# Patient Record
Sex: Female | Born: 1975 | Race: Black or African American | Hispanic: No | Marital: Married | Smoking: Current every day smoker
Health system: Southern US, Community
[De-identification: ages and names within clinical notes are randomized; demographics above are authoritative.]

## PROBLEM LIST (undated history)

## (undated) DIAGNOSIS — E079 Disorder of thyroid, unspecified: Secondary | ICD-10-CM

## (undated) DIAGNOSIS — J45909 Unspecified asthma, uncomplicated: Secondary | ICD-10-CM

---

## 1997-12-19 ENCOUNTER — Encounter: Payer: Self-pay | Admitting: Emergency Medicine

## 1997-12-19 ENCOUNTER — Emergency Department (HOSPITAL_COMMUNITY): Admission: EM | Admit: 1997-12-19 | Discharge: 1997-12-19 | Payer: Self-pay | Admitting: Emergency Medicine

## 1997-12-20 ENCOUNTER — Encounter: Payer: Self-pay | Admitting: Emergency Medicine

## 1998-01-03 ENCOUNTER — Emergency Department (HOSPITAL_COMMUNITY): Admission: EM | Admit: 1998-01-03 | Discharge: 1998-01-03 | Payer: Self-pay | Admitting: Emergency Medicine

## 2001-11-17 ENCOUNTER — Emergency Department (HOSPITAL_COMMUNITY): Admission: EM | Admit: 2001-11-17 | Discharge: 2001-11-18 | Payer: Self-pay | Admitting: Emergency Medicine

## 2002-02-27 ENCOUNTER — Emergency Department (HOSPITAL_COMMUNITY): Admission: EM | Admit: 2002-02-27 | Discharge: 2002-02-27 | Payer: Self-pay | Admitting: Emergency Medicine

## 2002-12-10 ENCOUNTER — Inpatient Hospital Stay (HOSPITAL_COMMUNITY): Admission: AD | Admit: 2002-12-10 | Discharge: 2002-12-10 | Payer: Self-pay | Admitting: Obstetrics & Gynecology

## 2002-12-10 ENCOUNTER — Encounter: Payer: Self-pay | Admitting: Obstetrics & Gynecology

## 2002-12-13 ENCOUNTER — Inpatient Hospital Stay (HOSPITAL_COMMUNITY): Admission: AD | Admit: 2002-12-13 | Discharge: 2002-12-13 | Payer: Self-pay | Admitting: Family Medicine

## 2004-01-13 ENCOUNTER — Emergency Department (HOSPITAL_COMMUNITY): Admission: EM | Admit: 2004-01-13 | Discharge: 2004-01-13 | Payer: Self-pay | Admitting: Emergency Medicine

## 2005-06-02 ENCOUNTER — Inpatient Hospital Stay (HOSPITAL_COMMUNITY): Admission: AD | Admit: 2005-06-02 | Discharge: 2005-06-02 | Payer: Self-pay | Admitting: Obstetrics and Gynecology

## 2005-06-08 ENCOUNTER — Inpatient Hospital Stay (HOSPITAL_COMMUNITY): Admission: AD | Admit: 2005-06-08 | Discharge: 2005-06-08 | Payer: Self-pay | Admitting: *Deleted

## 2010-04-30 ENCOUNTER — Encounter: Payer: Self-pay | Admitting: Family Medicine

## 2012-11-09 ENCOUNTER — Encounter (HOSPITAL_BASED_OUTPATIENT_CLINIC_OR_DEPARTMENT_OTHER): Payer: Self-pay | Admitting: *Deleted

## 2012-11-09 ENCOUNTER — Emergency Department (HOSPITAL_BASED_OUTPATIENT_CLINIC_OR_DEPARTMENT_OTHER)
Admission: EM | Admit: 2012-11-09 | Discharge: 2012-11-09 | Disposition: A | Discharge status: Home / Self Care | Payer: BC Managed Care – PPO | Attending: Emergency Medicine | Admitting: Emergency Medicine

## 2012-11-09 DIAGNOSIS — J45909 Unspecified asthma, uncomplicated: Secondary | ICD-10-CM | POA: Insufficient documentation

## 2012-11-09 DIAGNOSIS — M542 Cervicalgia: Secondary | ICD-10-CM

## 2012-11-09 DIAGNOSIS — Z87891 Personal history of nicotine dependence: Secondary | ICD-10-CM | POA: Insufficient documentation

## 2012-11-09 DIAGNOSIS — Z8639 Personal history of other endocrine, nutritional and metabolic disease: Secondary | ICD-10-CM | POA: Insufficient documentation

## 2012-11-09 DIAGNOSIS — Z862 Personal history of diseases of the blood and blood-forming organs and certain disorders involving the immune mechanism: Secondary | ICD-10-CM | POA: Insufficient documentation

## 2012-11-09 HISTORY — DX: Disorder of thyroid, unspecified: E07.9

## 2012-11-09 HISTORY — DX: Unspecified asthma, uncomplicated: J45.909

## 2012-11-09 MED ORDER — HYDROCODONE-ACETAMINOPHEN 5-325 MG PO TABS: 2.0000 | ORAL_TABLET | Freq: Four times a day (QID) | ORAL | Status: DC | PRN

## 2012-11-09 MED ORDER — DIAZEPAM 5 MG PO TABS: 5.0000 mg | ORAL_TABLET | Freq: Four times a day (QID) | ORAL | Status: DC | PRN

## 2012-11-09 NOTE — ED Provider Notes (Signed)
CSN: [id number]     Arrival date & time 11/09/12  1350 History     First MD Initiated Contact with Patient 11/09/12 1422     Chief Complaint  Patient presents with  . Neck Pain   (Consider location/radiation/quality/duration/timing/severity/associated sxs/prior Treatment) HPI This 37 year old female has cough congestion and wheezing shortness of breath a week ago it resolved after taking an inhaler steroids and Z-Pak, she now presents with a few days of gradual onset bilateral posterior neck pain worse with movement and better she stays still without radiation or associated symptoms. She is no fever no headache no change in speech vision swallowing or understanding. She is no focal weakness numbness or incoordination. She is no back pain or change in bowel or bladder function. She is no chest pain shortness breath abdominal pain vomiting or rashes or trauma. She has partial improvement using over-the-counter anti-inflammatories. Her pain is constant and mild to moderate worse with palpation and position changes of her head. She is able to walk without difficulty. She denies IV drug use or prior neck or back surgeries. Past Medical History  Diagnosis Date  . Asthma   . Thyroid disease    History reviewed. No pertinent past surgical history. No family history on file. History  Substance Use Topics  . Smoking status: Former Games developer  . Smokeless tobacco: Not on file  . Alcohol Use: No   OB History   Grav Para Term Preterm Abortions TAB SAB Ect Mult Living                 Review of Systems 10 Systems reviewed and are negative for acute change except as noted in the HPI. Allergies  Review of patient's allergies indicates no known allergies.  Home Medications   Current Outpatient Rx  Name  Route  Sig  Dispense  Refill  . albuterol (PROVENTIL HFA;VENTOLIN HFA) 108 (90 BASE) MCG/ACT inhaler   Inhalation   Inhale 2 puffs into the lungs every 6 (six) hours as needed for wheezing.          . diazepam (VALIUM) 5 MG tablet   Oral   Take 1 tablet (5 mg total) by mouth every 6 (six) hours as needed (spasms).   6 tablet   0   . HYDROcodone-acetaminophen (NORCO) 5-325 MG per tablet   Oral   Take 2 tablets by mouth every 6 (six) hours as needed for pain.   12 tablet   0    BP 150/92  Pulse 66  Temp(Src) 97.8 F (36.6 C) (Oral)  Resp 18  Ht 4\' 11"  (1.499 m)  Wt 110 lb (49.896 kg)  BMI 22.21 kg/m2  SpO2 100% Physical Exam  Nursing note and vitals reviewed. Constitutional:  Awake, alert, nontoxic appearance with baseline speech for patient.  HENT:  Head: Atraumatic.  Mouth/Throat: No oropharyngeal exudate.  Eyes: EOM are normal. Pupils are equal, round, and reactive to light. Right eye exhibits no discharge. Left eye exhibits no discharge.  Neck: Neck supple.  Minimally decreased active range of motion with good flexion good extension slightly decreased left and right rotation, no midline neck tenderness, no rash, she does have reproducible bilateral paracervical muscle tenderness, she has minimal bilateral trapezius muscle tenderness, her back is nontender  Cardiovascular: Normal rate and regular rhythm.   No murmur heard. Pulmonary/Chest: Effort normal and breath sounds normal. No stridor. No respiratory distress. She has no wheezes. She has no rales. She exhibits no tenderness.  Abdominal: Soft. Bowel sounds  are normal. She exhibits no mass. There is no tenderness. There is no rebound.  Musculoskeletal: She exhibits no tenderness.  Baseline ROM, moves extremities with no obvious new focal weakness.  Lymphadenopathy:    She has no cervical adenopathy.  Neurological: She is alert.  Awake, alert, cooperative and aware of situation; motor strength bilaterally; sensation normal to light touch bilaterally; peripheral visual fields full to confrontation; no facial asymmetry; tongue midline; major cranial nerves appear intact; no pronator drift, normal finger to nose  bilaterally  Skin: No rash noted.  Psychiatric: She has a normal mood and affect.    ED Course  Patient informed of clinical course, understand medical decision-making process, and agree with plan. Procedures (including critical care time)  Labs Reviewed - No data to display No results found. 1. Neck pain, bilateral     MDM  I doubt any other EMC precluding discharge at this time including, but not necessarily limited to the following:bacterial meningitis.  Hurman Horn, MD 11/09/12 2226

## 2012-11-09 NOTE — ED Notes (Signed)
Patient states that a week ago she was treated for bronchospasms, took a z-pack and prednisone. # days ago began having severe pain and stiffness in her neck

## 2013-04-26 ENCOUNTER — Emergency Department (HOSPITAL_BASED_OUTPATIENT_CLINIC_OR_DEPARTMENT_OTHER)
Admission: EM | Admit: 2013-04-26 | Discharge: 2013-04-26 | Disposition: A | Discharge status: Home / Self Care | Payer: BC Managed Care – PPO | Attending: Emergency Medicine | Admitting: Emergency Medicine

## 2013-04-26 ENCOUNTER — Encounter (HOSPITAL_BASED_OUTPATIENT_CLINIC_OR_DEPARTMENT_OTHER): Payer: Self-pay | Admitting: Emergency Medicine

## 2013-04-26 DIAGNOSIS — Z79899 Other long term (current) drug therapy: Secondary | ICD-10-CM | POA: Insufficient documentation

## 2013-04-26 DIAGNOSIS — R110 Nausea: Secondary | ICD-10-CM | POA: Insufficient documentation

## 2013-04-26 DIAGNOSIS — Z87891 Personal history of nicotine dependence: Secondary | ICD-10-CM | POA: Insufficient documentation

## 2013-04-26 DIAGNOSIS — J45901 Unspecified asthma with (acute) exacerbation: Secondary | ICD-10-CM | POA: Insufficient documentation

## 2013-04-26 DIAGNOSIS — Z8639 Personal history of other endocrine, nutritional and metabolic disease: Secondary | ICD-10-CM | POA: Insufficient documentation

## 2013-04-26 DIAGNOSIS — Z862 Personal history of diseases of the blood and blood-forming organs and certain disorders involving the immune mechanism: Secondary | ICD-10-CM | POA: Insufficient documentation

## 2013-04-26 MED ORDER — ALBUTEROL SULFATE HFA 108 (90 BASE) MCG/ACT IN AERS
2.0000 | INHALATION_SPRAY | Freq: Once | RESPIRATORY_TRACT | Status: AC
  Administered 2013-04-26: 2 via RESPIRATORY_TRACT
  Filled 2013-04-26: qty 6.7

## 2013-04-26 MED ORDER — ALBUTEROL SULFATE (2.5 MG/3ML) 0.083% IN NEBU
INHALATION_SOLUTION | RESPIRATORY_TRACT | Status: AC
  Administered 2013-04-26: 2.5 mg
  Filled 2013-04-26: qty 3

## 2013-04-26 MED ORDER — ALBUTEROL SULFATE (2.5 MG/3ML) 0.083% IN NEBU: 2.5000 mg | INHALATION_SOLUTION | Freq: Four times a day (QID) | RESPIRATORY_TRACT | Status: DC | PRN

## 2013-04-26 MED ORDER — PREDNISONE 10 MG PO TABS: 20.0000 mg | ORAL_TABLET | Freq: Two times a day (BID) | ORAL | Status: DC

## 2013-04-26 MED ORDER — IPRATROPIUM-ALBUTEROL 0.5-2.5 (3) MG/3ML IN SOLN
RESPIRATORY_TRACT | Status: AC
  Administered 2013-04-26: 3 mL
  Filled 2013-04-26: qty 3

## 2013-04-26 NOTE — ED Provider Notes (Signed)
CSN: [id number]     Arrival date & time 04/26/13  1155 History   First MD Initiated Contact with Patient 04/26/13 1336     Chief Complaint  Patient presents with  . Shortness of Breath  . Chest Pain   (Consider location/radiation/quality/duration/timing/severity/associated sxs/prior Treatment) Patient is a 38 y.o. female presenting with shortness of breath and chest pain. The history is provided by the patient.  Shortness of Breath Severity:  Moderate Onset quality:  Gradual Duration:  24 hours Timing:  Constant Progression:  Worsening Chronicity:  New Relieved by:  None tried Worsened by:  Activity and weather changes Ineffective treatments:  None tried Associated symptoms: chest pain, cough and wheezing   Associated symptoms: no abdominal pain, no ear pain, no fever, no sore throat and no vomiting   Chest Pain Associated symptoms: cough, nausea and shortness of breath   Associated symptoms: no abdominal pain, no fever and not vomiting    Dierdra J Fontanella is a 38 y.o. female who presents to the ED with wheezing and feeling short of breath. She states she is out of her inhaler so her symptoms worsened. She also has a nebulizer but is out of the medication for that as well.  She has a history of asthma.   Past Medical History  Diagnosis Date  . Asthma   . Thyroid disease    History reviewed. No pertinent past surgical history. History reviewed. No pertinent family history. History  Substance Use Topics  . Smoking status: Former Games developer  . Smokeless tobacco: Not on file  . Alcohol Use: No   OB History   Grav Para Term Preterm Abortions TAB SAB Ect Mult Living                 Review of Systems  Constitutional: Negative for fever and chills.  HENT: Negative for congestion, ear pain and sore throat.   Respiratory: Positive for cough, shortness of breath and wheezing.   Cardiovascular: Positive for chest pain.  Gastrointestinal: Positive for nausea. Negative for  vomiting and abdominal pain.  Genitourinary: Negative for dysuria, urgency and frequency.  Musculoskeletal: Negative for myalgias.  Neurological: Negative for syncope.  Psychiatric/Behavioral: Negative for confusion. The patient is not nervous/anxious.     Allergies  Grapefruit bioflavonoid complex and Shellfish allergy  Home Medications   Current Outpatient Rx  Name  Route  Sig  Dispense  Refill  . albuterol (PROVENTIL HFA;VENTOLIN HFA) 108 (90 BASE) MCG/ACT inhaler   Inhalation   Inhale 2 puffs into the lungs every 6 (six) hours as needed for wheezing.          BP 126/66  Pulse 82  Temp(Src) 98.2 F (36.8 C) (Oral)  Resp 20  SpO2 100%  LMP 04/26/2013 Physical Exam  Nursing note and vitals reviewed. Constitutional: She is oriented to person, place, and time. She appears well-developed and well-nourished. No distress.  HENT:  Head: Normocephalic and atraumatic.  Eyes: EOM are normal.  Neck: Neck supple.  Cardiovascular: Normal rate.   Pulmonary/Chest: Effort normal. She has wheezes.  Abdominal: Soft. There is no tenderness.  Musculoskeletal: Normal range of motion.  Neurological: She is alert and oriented to person, place, and time. No cranial nerve deficit.  Skin: Skin is warm and dry.  Psychiatric: She has a normal mood and affect. Her behavior is normal.    ED Course  Procedures Albuterol/atrovent neb. MDM  Examined after neb treatment and patient feeling much better, no chest tightness, no wheezing.  BP  126/66  Pulse 82  Temp(Src) 98.2 F (36.8 C) (Oral)  Resp 20  SpO2 100%  LMP 04/26/2013    38 y.o. female with cough and wheezing that cleared with neb treatment. She has been out of her home medications. Albuterol inhaler given here and Rx for medication for her home neb. Will also start prednisone.  Discussed with the patient clinical findings and plan of care. All questioned fully answered. She will return if any problems arise.    Medication List     TAKE these medications       predniSONE 10 MG tablet  Commonly known as:  DELTASONE  Take 2 tablets (20 mg total) by mouth 2 (two) times daily with a meal.      ASK your doctor about these medications       albuterol 108 (90 BASE) MCG/ACT inhaler  Commonly known as:  PROVENTIL HFA;VENTOLIN HFA  Inhale 2 puffs into the lungs every 6 (six) hours as needed for wheezing.  Ask about: Which instructions should I use?     albuterol (2.5 MG/3ML) 0.083% nebulizer solution  Commonly known as:  PROVENTIL  Take 3 mLs (2.5 mg total) by nebulization every 6 (six) hours as needed for wheezing or shortness of breath.  Ask about: Which instructions should I use?         [address]Hope WhitakerM Neese, [place]NP 04/28/13 [phone]

## 2013-04-26 NOTE — Discharge Instructions (Signed)
Asthma, Adult  Asthma is a condition of the lungs in which the airways tighten and narrow. Asthma can make it hard to breathe. Asthma cannot be cured, but medicine and lifestyle changes can help control it. Asthma may be started (triggered) by:  · Animal skin flakes (dander).  · Dust.  · Cockroaches.  · Pollen.  · Mold.  · Smoke.  · Cleaning products.  · Hair sprays or aerosol sprays.  · Paint fumes or strong smells.  · Cold air, weather changes, and winds.  · Crying or laughing hard.  · Stress.  · Certain medicines or drugs.  · Foods, such as dried fruit, potato chips, and sparkling grape juice.  · Infections or conditions (colds, flu).  · Exercise.  · Certain medical conditions or diseases.  · Exercise or tiring activities.  HOME CARE   · Take medicine as told by your doctor.  · Use a peak flow meter as told by your doctor. A peak flow meter is a tool that measures how well the lungs are working.  · Record and keep track of the peak flow meter's readings.  · Understand and use the asthma action plan. An asthma action plan is a written plan for taking care of your asthma and treating your attacks.  · To help prevent asthma attacks:  · Do not smoke. Stay away from secondhand smoke.  · Change your heating and air conditioning filter often.  · Limit your use of fireplaces and wood stoves.  · Get rid of pests (such as roaches and mice) and their droppings.  · Throw away plants if you see mold on them.  · Clean your floors. Dust regularly. Use cleaning products that do not smell.  · Have someone vacuum when you are not home. Use a vacuum cleaner with a HEPA filter if possible.  · Replace carpet with wood, tile, or vinyl flooring. Carpet can trap animal skin flakes and dust.  · Use allergy-proof pillows, mattress covers, and box spring covers.  · Wash bed sheets and blankets every week in hot water and dry them in a dryer.  · Use blankets that are made of polyester or cotton.  · Clean bathrooms and kitchens with bleach.  If possible, have someone repaint the walls in these rooms with mold-resistant paint. Keep out of the rooms that are being cleaned and painted.  · Wash hands often.  GET HELP IF:  · You have make a whistling sound when breaking (wheeze), have shortness of breath, or have a cough even if taking medicine to prevent attacks.  · The colored mucus you cough up (sputum) is thicker than usual.  · The colored mucus you cough up changes from clear or white to yellow, green, gray, or bloody.  · You have problems from the medicine you are taking such as:  · A rash.  · Itching.  · Swelling.  · Trouble breathing.  · You need reliever medicines more than 2 3 times a week.  · Your peak flow measurement is still at 50 79% of your personal best after following the action plan for 1 hour.  GET HELP RIGHT AWAY IF:   · You seem to be worse and are not responding to medicine during an asthma attack.  · You are short of breath even at rest.  · You get short of breath when doing very little activity.  · You have trouble eating, drinking, or talking.  · You have chest pain.  ·   You have a fast heartbeat.  · Your lips or fingernails start to turn blue.  · You are lightheaded, dizzy, or faint.  · Your peak flow is less than 50% of your personal best.  · You have a fever or lasting symptoms for more than 2 3 days.  · You have a fever and your symptoms suddenly get worse.  MAKE SURE YOU:   · Understand these instructions.  · Will watch your condition.  · Will get help right away if you are not doing well or get worse.  Document Released: 09/12/2007 Document Revised: 01/14/2013 Document Reviewed: 10/23/2012  Exit[employer]® Patient Information ©2014 [employer].

## 2013-04-26 NOTE — ED Provider Notes (Signed)
Date: 04/26/2013  Rate: 70  Rhythm: normal sinus rhythm  QRS Axis: normal  Intervals: normal  ST/T Wave abnormalities: normal  Conduction Disutrbances: none  Narrative Interpretation: unremarkable     Doug SouSam Dionis Autry, MD 04/26/13 1718

## 2013-04-26 NOTE — ED Notes (Signed)
Pt having sob and chest pain since yesterday.  No known fever.  Pt wheezing.

## 2013-04-29 NOTE — ED Provider Notes (Signed)
Medical screening examination/treatment/procedure(s) were performed by non-physician practitioner and as supervising physician I was immediately available for consultation/collaboration.  EKG Interpretation    Date/Time:  Sunday April 26 2013 12:08:46 EST Ventricular Rate:  70 PR Interval:  136 QRS Duration: 74 QT Interval:  412 QTC Calculation: 444 R Axis:   51 Text Interpretation:  Sinus rhythm with Premature atrial complexes Moderate voltage criteria for LVH, may be normal variant Borderline ECG ED PHYSICIAN INTERPRETATION AVAILABLE IN [facility] Confirmed by TEST, RECORD ([id number]) on 04/28/2013 7:33:20 AM             Doug SouSam Rorik Vespa, MD 04/29/13 [phone]

## 2013-06-08 ENCOUNTER — Emergency Department (HOSPITAL_BASED_OUTPATIENT_CLINIC_OR_DEPARTMENT_OTHER)
Admission: EM | Admit: 2013-06-08 | Discharge: 2013-06-09 | Discharge status: Against Medical Advice | Payer: BC Managed Care – PPO | Attending: Emergency Medicine | Admitting: Emergency Medicine

## 2013-06-08 ENCOUNTER — Encounter (HOSPITAL_BASED_OUTPATIENT_CLINIC_OR_DEPARTMENT_OTHER): Payer: Self-pay | Admitting: Emergency Medicine

## 2013-06-08 DIAGNOSIS — J029 Acute pharyngitis, unspecified: Secondary | ICD-10-CM | POA: Insufficient documentation

## 2013-06-08 DIAGNOSIS — J45909 Unspecified asthma, uncomplicated: Secondary | ICD-10-CM | POA: Insufficient documentation

## 2013-06-08 DIAGNOSIS — Z87891 Personal history of nicotine dependence: Secondary | ICD-10-CM | POA: Insufficient documentation

## 2013-06-08 DIAGNOSIS — H9209 Otalgia, unspecified ear: Secondary | ICD-10-CM | POA: Insufficient documentation

## 2013-06-08 NOTE — ED Notes (Signed)
Pain in her right ear since yesterday. Sore throat and headache.

## 2013-06-08 NOTE — ED Notes (Signed)
Pt. Left with out being seen by EDP.   Pt. Was asked if she could be helped and she continued to walk out.

## 2015-07-30 ENCOUNTER — Emergency Department (HOSPITAL_BASED_OUTPATIENT_CLINIC_OR_DEPARTMENT_OTHER): Payer: Self-pay

## 2015-07-30 ENCOUNTER — Encounter (HOSPITAL_BASED_OUTPATIENT_CLINIC_OR_DEPARTMENT_OTHER): Payer: Self-pay | Admitting: *Deleted

## 2015-07-30 ENCOUNTER — Emergency Department (HOSPITAL_BASED_OUTPATIENT_CLINIC_OR_DEPARTMENT_OTHER)
Admission: EM | Admit: 2015-07-30 | Discharge: 2015-07-30 | Disposition: A | Discharge status: Home / Self Care | Payer: Self-pay | Attending: Emergency Medicine | Admitting: Emergency Medicine

## 2015-07-30 DIAGNOSIS — Z7952 Long term (current) use of systemic steroids: Secondary | ICD-10-CM | POA: Insufficient documentation

## 2015-07-30 DIAGNOSIS — J45909 Unspecified asthma, uncomplicated: Secondary | ICD-10-CM | POA: Insufficient documentation

## 2015-07-30 DIAGNOSIS — F17200 Nicotine dependence, unspecified, uncomplicated: Secondary | ICD-10-CM | POA: Insufficient documentation

## 2015-07-30 DIAGNOSIS — Z79899 Other long term (current) drug therapy: Secondary | ICD-10-CM | POA: Insufficient documentation

## 2015-07-30 DIAGNOSIS — M25511 Pain in right shoulder: Secondary | ICD-10-CM | POA: Insufficient documentation

## 2015-07-30 MED ORDER — NAPROXEN 500 MG PO TABS: 500.0000 mg | ORAL_TABLET | Freq: Two times a day (BID) | ORAL | Status: DC

## 2015-07-30 MED ORDER — CYCLOBENZAPRINE HCL 10 MG PO TABS: 10.0000 mg | ORAL_TABLET | Freq: Two times a day (BID) | ORAL | Status: DC | PRN

## 2015-07-30 NOTE — ED Notes (Signed)
Patient c/o R shoulder pain that has grown worse over the past two weeks, She states that she has been taking ibuprofen and the pain sometimes extends to her left shoulder

## 2015-07-30 NOTE — ED Provider Notes (Signed)
CSN: [id number]     Arrival date & time 07/30/15  1216 History   First MD Initiated Contact with Patient 07/30/15 1245     Chief Complaint  Patient presents with  . Shoulder Pain     (Consider location/radiation/quality/duration/timing/severity/associated sxs/prior Treatment) HPI  40 year old female presents with 2 half weeks of right shoulder pain. Now starting to develop some pain into her left shoulder. Points over her trapezius as the source of most pain. No neck pain. Occasionally feels tingling in her right arm, none now. No weakness. No specific injury. Denies any chest pain or shortness of breath. Has been taking ibuprofen with no significant relief. Has been taking Icy-hot with lidocaine with some partial relief.  Past Medical History  Diagnosis Date  . Asthma   . Thyroid disease    History reviewed. No pertinent past surgical history. No family history on file. Social History  Substance Use Topics  . Smoking status: Current Every Day Smoker  . Smokeless tobacco: None  . Alcohol Use: Yes   OB History    No data available     Review of Systems  Respiratory: Negative for shortness of breath.   Cardiovascular: Negative for chest pain.  Musculoskeletal: Positive for arthralgias. Negative for joint swelling.  Neurological: Negative for weakness.  All other systems reviewed and are negative.     Allergies  Grapefruit bioflavonoid complex and Shellfish allergy  Home Medications   Prior to Admission medications   Medication Sig Start Date End Date Taking? Authorizing Provider  albuterol (PROVENTIL HFA;VENTOLIN HFA) 108 (90 BASE) MCG/ACT inhaler Inhale 2 puffs into the lungs every 6 (six) hours as needed for wheezing.    Historical Provider, MD  albuterol (PROVENTIL) (2.5 MG/3ML) 0.083% nebulizer solution Take 3 mLs (2.5 mg total) by nebulization every 6 (six) hours as needed for wheezing or shortness of breath. 04/26/13   Hope Orlene OchM Neese, NP  predniSONE (DELTASONE) 10 MG  tablet Take 2 tablets (20 mg total) by mouth 2 (two) times daily with a meal. 04/26/13   Hope Orlene OchM Neese, NP   BP 141/85 mmHg  Pulse 79  Temp(Src) 98.9 F (37.2 C) (Oral)  Resp 18  Ht 4\' 11"  (1.499 m)  Wt 120 lb (54.432 kg)  BMI 24.22 kg/m2  SpO2 99% Physical Exam  Constitutional: She is oriented to person, place, and time. She appears well-developed and well-nourished.  HENT:  Head: Normocephalic and atraumatic.  Right Ear: External ear normal.  Left Ear: External ear normal.  Nose: Nose normal.  Eyes: Right eye exhibits no discharge. Left eye exhibits no discharge.  Cardiovascular: Normal rate, regular rhythm and normal heart sounds.   Pulses:      Radial pulses are 2+ on the right side, and 2+ on the left side.  Pulmonary/Chest: Effort normal and breath sounds normal.  Abdominal: She exhibits no distension.  Musculoskeletal:       Right shoulder: She exhibits tenderness. She exhibits normal range of motion.       Left shoulder: She exhibits normal range of motion.       Back:       Right upper arm: She exhibits no tenderness.       Left upper arm: She exhibits no tenderness.  Neurological: She is alert and oriented to person, place, and time.  5/5 strength in bilateral upper extremities. Normal gross sensation  Skin: Skin is warm and dry.  Nursing note and vitals reviewed.   ED Course  Procedures (including critical care  time) Labs Review Labs Reviewed - No data to display  Imaging Review Dg Shoulder Right  07/30/2015  CLINICAL DATA:  Right shoulder pain for 3 weeks.  No known injury. EXAM: RIGHT SHOULDER - 2+ VIEW COMPARISON:  None. FINDINGS: There is no evidence of fracture or dislocation. There is no evidence of arthropathy or other focal bone abnormality. Soft tissues are unremarkable. IMPRESSION: Negative. Electronically Signed   By: Myles Rosenthal M.D.   On: 07/30/2015 13:29   I have personally reviewed and evaluated these images and lab results as part of my medical  decision-making.   EKG Interpretation None      MDM   Final diagnoses:  Right shoulder pain    Patient's pain is likely muscular in etiology. It is mostly over her trapezius, right greater than left. Will have her stop ibuprofen and switch to Naprosyn and give her Flexeril as well. Continue ice/heat. Follow up with sports medicine and possible physical therapy. No evidence of fracture. Neurovascularly intact.    Pricilla Loveless, MD 07/30/15 [phone]

## 2015-10-12 ENCOUNTER — Telehealth: Payer: Self-pay | Admitting: *Deleted

## 2015-10-12 NOTE — Telephone Encounter (Signed)
Unable to reach patient at time of Pre-Visit Call. Phone # on file is invalid.

## 2015-10-13 ENCOUNTER — Ambulatory Visit: Payer: BLUE CROSS/BLUE SHIELD | Admitting: Family Medicine

## 2015-10-13 DIAGNOSIS — Z0289 Encounter for other administrative examinations: Secondary | ICD-10-CM

## 2015-10-14 ENCOUNTER — Encounter: Payer: Self-pay | Admitting: General Practice

## 2015-10-14 ENCOUNTER — Telehealth: Payer: Self-pay | Admitting: General Practice

## 2015-10-14 NOTE — Telephone Encounter (Signed)
Patient was scheduled for New Patient appointment 7/6 and was No Show. Charge or No Charge?

## 2015-10-14 NOTE — Telephone Encounter (Signed)
No charge this time but please send a letter reminding her of policy

## 2019-08-27 ENCOUNTER — Emergency Department (HOSPITAL_BASED_OUTPATIENT_CLINIC_OR_DEPARTMENT_OTHER)
Admission: EM | Admit: 2019-08-27 | Discharge: 2019-08-27 | Disposition: A | Discharge status: Home / Self Care | Payer: BC Managed Care – PPO | Attending: Emergency Medicine | Admitting: Emergency Medicine

## 2019-08-27 ENCOUNTER — Encounter (HOSPITAL_BASED_OUTPATIENT_CLINIC_OR_DEPARTMENT_OTHER): Payer: Self-pay | Admitting: *Deleted

## 2019-08-27 ENCOUNTER — Other Ambulatory Visit: Payer: Self-pay

## 2019-08-27 ENCOUNTER — Emergency Department (HOSPITAL_BASED_OUTPATIENT_CLINIC_OR_DEPARTMENT_OTHER): Payer: BC Managed Care – PPO

## 2019-08-27 DIAGNOSIS — S60122A Contusion of left index finger with damage to nail, initial encounter: Secondary | ICD-10-CM | POA: Diagnosis not present

## 2019-08-27 DIAGNOSIS — S6710XA Crushing injury of unspecified finger(s), initial encounter: Secondary | ICD-10-CM

## 2019-08-27 DIAGNOSIS — Y999 Unspecified external cause status: Secondary | ICD-10-CM | POA: Diagnosis not present

## 2019-08-27 DIAGNOSIS — J45909 Unspecified asthma, uncomplicated: Secondary | ICD-10-CM | POA: Insufficient documentation

## 2019-08-27 DIAGNOSIS — Y929 Unspecified place or not applicable: Secondary | ICD-10-CM | POA: Insufficient documentation

## 2019-08-27 DIAGNOSIS — F17210 Nicotine dependence, cigarettes, uncomplicated: Secondary | ICD-10-CM | POA: Insufficient documentation

## 2019-08-27 DIAGNOSIS — W231XXA Caught, crushed, jammed, or pinched between stationary objects, initial encounter: Secondary | ICD-10-CM | POA: Diagnosis not present

## 2019-08-27 DIAGNOSIS — S6992XA Unspecified injury of left wrist, hand and finger(s), initial encounter: Secondary | ICD-10-CM | POA: Diagnosis present

## 2019-08-27 DIAGNOSIS — S6010XA Contusion of unspecified finger with damage to nail, initial encounter: Secondary | ICD-10-CM

## 2019-08-27 DIAGNOSIS — Y939 Activity, unspecified: Secondary | ICD-10-CM | POA: Insufficient documentation

## 2019-08-27 NOTE — ED Notes (Signed)
drg applied to left index finger

## 2019-08-27 NOTE — ED Provider Notes (Signed)
[facility] Provider Note: Georgena Spurling, MD, FACEP  CSN: [id number] MRN: [id number] ARRIVAL: 08/27/19 at 2029 ROOM: [facility]   HISTORY OF PRESENT ILLNESS  08/27/19 11:16 PM Marissa Murphy is a 44 y.o. female who slammed her left index finger and door yesterday.  She has a resulting subungual hematoma of that finger.  She rates the pain as a 9 out of 10, worse with palpation or movement.   Past Medical History:  Diagnosis Date  . Asthma   . Thyroid disease     History reviewed. No pertinent surgical history.  No family history on file.  Social History   Tobacco Use  . Smoking status: Current Every Day Smoker  Substance Use Topics  . Alcohol use: Yes  . Drug use: No    Prior to Admission medications   Medication Sig Start Date End Date Taking? Authorizing Provider  albuterol (PROVENTIL HFA;VENTOLIN HFA) 108 (90 BASE) MCG/ACT inhaler Inhale 2 puffs into the lungs every 6 (six) hours as needed for wheezing.    [provider]    Allergies Grapefruit bioflavonoid complex and Shellfish allergy   REVIEW OF SYSTEMS  Negative except as noted here or in the History of Present Illness.   PHYSICAL EXAMINATION  Initial Vital Signs Blood pressure (!) 155/100, pulse 83, temperature 98.2 F (36.8 C), temperature source Oral, resp. rate 18, height 4\' 11"  (1.499 m), weight 58.1 kg, SpO2 100 %.  Examination General: Well-developed, well-nourished female in no acute distress; appearance consistent with age of record HENT: normocephalic; atraumatic Eyes: Normal appearance Neck: supple Heart: regular rate and rhythm Lungs: clear to auscultation bilaterally Abdomen: soft; nondistended; nontender; bowel sounds present Extremities: No deformity; full range of motion Neurologic: Awake, alert and oriented; motor function intact in all extremities and symmetric; no facial droop Skin: Warm and dry; tenderness of  distal phalanx of left index finger with subungual hematoma Psychiatric: Normal mood and affect   RESULTS  Summary of this visit's results, reviewed and interpreted by myself:   EKG Interpretation  Date/Time:    Ventricular Rate:    PR Interval:    QRS Duration:   QT Interval:    QTC Calculation:   R Axis:     Text Interpretation:        Laboratory Studies: No results found for this or any previous visit (from the past 24 hour(s)). Imaging Studies: DG Finger Index Left  Result Date: 08/27/2019 CLINICAL DATA:  Acute pain due to trauma. EXAM: LEFT INDEX FINGER 2+V COMPARISON:  None. FINDINGS: There is no evidence of fracture or dislocation. There is no evidence of arthropathy or other focal bone abnormality. Soft tissues are unremarkable. IMPRESSION: Negative. Electronically Signed   By: Constance Holster M.D.   On: 08/27/2019 21:00    ED COURSE and MDM  Nursing notes, initial and subsequent vitals signs, including pulse oximetry, reviewed and interpreted by myself.  Vitals:   08/27/19 2040 08/27/19 2041  BP:  (!) 155/100  Pulse:  83  Resp:  18  Temp:  98.2 F (36.8 C)  TempSrc:  Oral  SpO2:  100%  Weight: 58.1 kg   Height: 4\' 11"  (1.499 m)    Medications - No data to display  No evidence of fracture on radiograph.  PROCEDURES  Procedures  NAIL TREPHINATION After informed verbal consent was obtained the patient's left index finger nail was trephinated using an electric pen cautery.  There was release of  a moderate quantity of blood.  The patient tolerated this well and there were no immediate complications.  ED DIAGNOSES     ICD-10-CM   1. Crushing injury of finger, initial encounter  S67.10XA   2. Subungual hematoma of digit of hand, initial encounter  S60.10XA        Tayden Duran, MD 08/27/19 2330

## 2019-08-27 NOTE — ED Triage Notes (Signed)
Pt c/o left index finger/ nail bed injury x 1 day ago

## 2019-11-29 ENCOUNTER — Other Ambulatory Visit: Payer: Self-pay

## 2019-11-29 ENCOUNTER — Emergency Department (HOSPITAL_BASED_OUTPATIENT_CLINIC_OR_DEPARTMENT_OTHER)
Admission: EM | Admit: 2019-11-29 | Discharge: 2019-11-29 | Disposition: A | Discharge status: Home / Self Care | Payer: BC Managed Care – PPO | Attending: Emergency Medicine | Admitting: Emergency Medicine

## 2019-11-29 ENCOUNTER — Encounter (HOSPITAL_BASED_OUTPATIENT_CLINIC_OR_DEPARTMENT_OTHER): Payer: Self-pay

## 2019-11-29 ENCOUNTER — Emergency Department (HOSPITAL_BASED_OUTPATIENT_CLINIC_OR_DEPARTMENT_OTHER): Payer: BC Managed Care – PPO

## 2019-11-29 DIAGNOSIS — M7918 Myalgia, other site: Secondary | ICD-10-CM | POA: Diagnosis not present

## 2019-11-29 DIAGNOSIS — R1012 Left upper quadrant pain: Secondary | ICD-10-CM | POA: Diagnosis present

## 2019-11-29 DIAGNOSIS — J45909 Unspecified asthma, uncomplicated: Secondary | ICD-10-CM | POA: Diagnosis not present

## 2019-11-29 DIAGNOSIS — E876 Hypokalemia: Secondary | ICD-10-CM

## 2019-11-29 DIAGNOSIS — R1013 Epigastric pain: Secondary | ICD-10-CM | POA: Diagnosis not present

## 2019-11-29 DIAGNOSIS — R1010 Upper abdominal pain, unspecified: Secondary | ICD-10-CM

## 2019-11-29 LAB — URINALYSIS, ROUTINE W REFLEX MICROSCOPIC
Bilirubin Urine: NEGATIVE
Glucose, UA: NEGATIVE mg/dL
Ketones, ur: NEGATIVE mg/dL
Nitrite: NEGATIVE
Protein, ur: NEGATIVE mg/dL
Specific Gravity, Urine: 1.02 (ref 1.005–1.030)
pH: 7.5 (ref 5.0–8.0)

## 2019-11-29 LAB — COMPREHENSIVE METABOLIC PANEL
ALT: 15 U/L (ref 0–44)
AST: 17 U/L (ref 15–41)
Albumin: 3.9 g/dL (ref 3.5–5.0)
Alkaline Phosphatase: 96 U/L (ref 38–126)
Anion gap: 8 (ref 5–15)
BUN: 9 mg/dL (ref 6–20)
CO2: 25 mmol/L (ref 22–32)
Calcium: 8.8 mg/dL — ABNORMAL LOW (ref 8.9–10.3)
Chloride: 106 mmol/L (ref 98–111)
Creatinine, Ser: 0.49 mg/dL (ref 0.44–1.00)
GFR calc Af Amer: >60 mL/min (ref >60)
GFR calc non Af Amer: >60 mL/min (ref >60)
Glucose, Bld: 109 mg/dL — ABNORMAL HIGH (ref 70–99)
Potassium: 2.9 mmol/L — ABNORMAL LOW (ref 3.5–5.1)
Sodium: 139 mmol/L (ref 135–145)
Total Bilirubin: 0.1 mg/dL — ABNORMAL LOW (ref 0.3–1.2)
Total Protein: 6.4 g/dL — ABNORMAL LOW (ref 6.5–8.1)

## 2019-11-29 LAB — URINALYSIS, MICROSCOPIC (REFLEX)

## 2019-11-29 LAB — CBC WITH DIFFERENTIAL/PLATELET
Abs Immature Granulocytes: 0.02 10*3/uL (ref 0.00–0.07)
Basophils Absolute: 0 10*3/uL (ref 0.0–0.1)
Basophils Relative: 0 %
Eosinophils Absolute: 0.2 10*3/uL (ref 0.0–0.5)
Eosinophils Relative: 3 %
HCT: 41.3 % (ref 36.0–46.0)
Hemoglobin: 13.5 g/dL (ref 12.0–15.0)
Immature Granulocytes: 0 %
Lymphocytes Relative: 30 %
Lymphs Abs: 2.5 10*3/uL (ref 0.7–4.0)
MCH: 29.5 pg (ref 26.0–34.0)
MCHC: 32.7 g/dL (ref 30.0–36.0)
MCV: 90.2 fL (ref 80.0–100.0)
Monocytes Absolute: 0.6 10*3/uL (ref 0.1–1.0)
Monocytes Relative: 8 %
Neutro Abs: 4.9 10*3/uL (ref 1.7–7.7)
Neutrophils Relative %: 59 %
Platelets: 294 10*3/uL (ref 150–400)
RBC: 4.58 MIL/uL (ref 3.87–5.11)
RDW: 12.5 % (ref 11.5–15.5)
WBC: 8.3 10*3/uL (ref 4.0–10.5)
nRBC: 0 % (ref 0.0–0.2)

## 2019-11-29 LAB — PREGNANCY, URINE: Preg Test, Ur: NEGATIVE

## 2019-11-29 LAB — LIPASE, BLOOD: Lipase: 24 U/L (ref 11–51)

## 2019-11-29 MED ORDER — OMEPRAZOLE 20 MG PO CPDR: 20.0000 mg | DELAYED_RELEASE_CAPSULE | Freq: Every day | ORAL | 0 refills | Status: AC

## 2019-11-29 MED ORDER — POTASSIUM CHLORIDE 20 MEQ/15ML (10%) PO SOLN
40.0000 meq | Freq: Once | ORAL | Status: AC
  Administered 2019-11-29: 40 meq via ORAL
  Filled 2019-11-29: qty 30

## 2019-11-29 MED ORDER — POT BICARB-POT CHLORIDE 25 MEQ PO TBEF: 50.0000 meq | EFFERVESCENT_TABLET | Freq: Every day | ORAL | 0 refills | Status: AC

## 2019-11-29 NOTE — ED Notes (Signed)
Patient transported to Ultrasound 

## 2019-11-29 NOTE — ED Triage Notes (Signed)
Reports left flank pain for three weeks and feels a pulsating sensation in the abdomen that moves to different areas.  Also endorses left calf pain for the last three days.  Denies any n/v/d.

## 2019-11-29 NOTE — ED Provider Notes (Signed)
[facility] EMERGENCY DEPARTMENT Provider Note   CSN: [id number] Arrival date & time: 11/29/19  1913     History Chief Complaint  Patient presents with  . Flank Pain  . Abdominal Pain    Marissa Murphy is a 44 y.o. female.  Patient is a 44 year old female with a history of migraines, asthma who is presenting today with several complaints.  She reports for the last 3 weeks she has been getting intermittent sharp pains in her left flank and left upper abdomen.  These occur for seconds at a time but do not seem to be precipitated by any specific event.  They do not seem to be worse with eating or certain movements.  She denies any dysuria, frequency or urgency.  Bowel movements have been normal.  No fever.  She has had no nausea or vomiting.  She has never had anything like this before.  She denies alcohol use but does take BC powders and ibuprofen intermittently for her migraines which she has recently stopped.  She denies heartburn-like symptoms and has not had cough, congestion or new shortness of breath.  She states because she has asthma she always has some shortness of breath but it is her baseline.  Also approximately 3 days ago she started to develop pain in her left calf.  She has no prior history of DVT or PE.  She takes no estrogens and has no significant family history except her grandma who had a DVT after a knee replacement surgery.  She denies any recent immobilization or long travel.   Flank Pain Associated symptoms include abdominal pain.  Abdominal Pain      Past Medical History:  Diagnosis Date  . Asthma   . Thyroid disease     There are no problems to display for this patient.   History reviewed. No pertinent surgical history.   OB History   No obstetric history on file.     No family history on file.  Social History   Tobacco Use  . Smoking status: Current Every Day Smoker  . Smokeless tobacco: Never Used  Substance Use Topics  .  Alcohol use: Yes  . Drug use: No    Home Medications Prior to Admission medications   Medication Sig Start Date End Date Taking? Authorizing Provider  albuterol (PROVENTIL HFA;VENTOLIN HFA) 108 (90 BASE) MCG/ACT inhaler Inhale 2 puffs into the lungs every 6 (six) hours as needed for wheezing.    [provider]    Allergies    Grapefruit bioflavonoid complex and Shellfish allergy  Review of Systems   Review of Systems  Gastrointestinal: Positive for abdominal pain.  Genitourinary: Positive for flank pain.  All other systems reviewed and are negative.   Physical Exam Updated Vital Signs BP (!) 152/100 (BP Location: Left Arm)   Pulse 86   Temp 98.3 F (36.8 C) (Oral)   Resp 18   Ht 4\' 11"  (1.499 m)   Wt 54.4 kg   SpO2 100%   BMI 24.24 kg/m   Physical Exam Vitals and nursing note reviewed.  Constitutional:      General: She is not in acute distress.    Appearance: She is well-developed and normal weight.  HENT:     Head: Normocephalic and atraumatic.  Eyes:     Pupils: Pupils are equal, round, and reactive to light.  Cardiovascular:     Rate and Rhythm: Normal rate and regular rhythm.     Heart sounds: Normal heart  sounds. No murmur heard.  No friction rub.  Pulmonary:     Effort: Pulmonary effort is normal.     Breath sounds: Normal breath sounds. No wheezing or rales.  Abdominal:     General: Bowel sounds are normal. There is no distension.     Palpations: Abdomen is soft.     Tenderness: There is abdominal tenderness in the epigastric area. There is no right CVA tenderness, left CVA tenderness, guarding or rebound.  Musculoskeletal:        General: Tenderness present. Normal range of motion.     Comments: No edema.  Tenderness in the left posterior proximal calf  Skin:    General: Skin is warm and dry.     Findings: No rash.  Neurological:     Mental Status: She is alert and oriented to person, place, and time.     Cranial Nerves: No cranial nerve  deficit.  Psychiatric:        Behavior: Behavior normal.     ED Results / Procedures / Treatments   Labs (all labs ordered are listed, but only abnormal results are displayed) Labs Reviewed  CBC WITH DIFFERENTIAL/PLATELET  COMPREHENSIVE METABOLIC PANEL  LIPASE, BLOOD  URINALYSIS, ROUTINE W REFLEX MICROSCOPIC  PREGNANCY, URINE    EKG EKG Interpretation  Date/Time:  Sunday November 29 2019 19:30:08 EDT Ventricular Rate:  83 PR Interval:    QRS Duration: 87 QT Interval:  403 QTC Calculation: 474 R Axis:   33 Text Interpretation: Sinus rhythm Left ventricular hypertrophy No significant change since last tracing Confirmed by Gwyneth Sprout ([id number]) on 11/29/2019 7:41:09 PM   Radiology No results found.  Procedures Procedures (including critical care time)  Medications Ordered in ED Medications - No data to display  ED Course  I have reviewed the triage vital signs and the nursing notes.  Pertinent labs & imaging results that were available during my care of the patient were reviewed by me and considered in my medical decision making (see chart for details).    MDM Rules/Calculators/A&P                          Patient presenting with symptoms of atypical abdominal pain.  It comes seconds at a time and is sharp in nature.  It is in the left flank and abdomen area.  She does take significant NSAIDs due to migraine headaches but symptoms do not seem classic for PUD.  She has no nausea or vomiting and lower suspicion for pancreatitis.  Minimal tenderness in the epigastric area on exam but no Murphy sign or concern for cholecystitis.  Patient has never had renal stones and denies any urinary symptoms.  Labs are pending.  Secondly patient is having some left calf pain which is new in the last 3 days.  Denies any injury has no significant swelling but will do a DVT study to rule out acute pathology.  10:49 PM Urine pregnancy test is negative, UA which showed trace hemoglobin but  no red cells on exam with rare bacteria, CBC within normal limits, lipase within normal limits, CMP with hypokalemia of 2.9 of unknown causes patient is not an alcohol user does not take blood pressure medications but could be the cause of her intermittent sharp pains.  Also concern for possible gastritis.  Low suspicion for kidney stone at this time.  Given patient's abdominal exam is benign do not feel that she needs a CT at this time.  Will replace potassium orally and encouraged to follow-up with PCP.  Also instructed to avoid NSAIDs and BC powders.  Was given a 2-week course of omeprazole.  Patient will follow up if symptoms do not improve.  MDM Number of Diagnoses or Management Options   Amount and/or Complexity of Data Reviewed Clinical lab tests: ordered and reviewed Tests in the radiology section of CPT: ordered and reviewed Tests in the medicine section of CPT: ordered and reviewed Obtain history from someone other than the patient: no Review and summarize past medical records: yes Independent visualization of images, tracings, or specimens: yes  Risk of Complications, Morbidity, and/or Mortality Presenting problems: moderate Diagnostic procedures: low Management options: low  Patient Progress Patient progress: stable   Final Clinical Impression(s) / ED Diagnoses Final diagnoses:  Hypokalemia  Upper abdominal pain    Rx / DC Orders ED Discharge Orders         Ordered    POT BICARB-POT CHLORIDE,25MEQ, 25 MEQ TBEF  Daily        11/29/19 2216    omeprazole (PRILOSEC) 20 MG capsule  Daily        11/29/19 2216           Gwyneth Sprout, MD 11/29/19 2251

## 2019-12-26 ENCOUNTER — Encounter (HOSPITAL_BASED_OUTPATIENT_CLINIC_OR_DEPARTMENT_OTHER): Payer: Self-pay

## 2019-12-26 ENCOUNTER — Emergency Department (HOSPITAL_BASED_OUTPATIENT_CLINIC_OR_DEPARTMENT_OTHER)
Admission: EM | Admit: 2019-12-26 | Discharge: 2019-12-26 | Disposition: A | Discharge status: Home / Self Care | Payer: BC Managed Care – PPO | Attending: Emergency Medicine | Admitting: Emergency Medicine

## 2019-12-26 ENCOUNTER — Emergency Department (HOSPITAL_BASED_OUTPATIENT_CLINIC_OR_DEPARTMENT_OTHER): Payer: BC Managed Care – PPO

## 2019-12-26 ENCOUNTER — Other Ambulatory Visit: Payer: Self-pay

## 2019-12-26 DIAGNOSIS — Z7951 Long term (current) use of inhaled steroids: Secondary | ICD-10-CM | POA: Insufficient documentation

## 2019-12-26 DIAGNOSIS — S0990XA Unspecified injury of head, initial encounter: Secondary | ICD-10-CM | POA: Diagnosis not present

## 2019-12-26 DIAGNOSIS — S7012XA Contusion of left thigh, initial encounter: Secondary | ICD-10-CM | POA: Diagnosis not present

## 2019-12-26 DIAGNOSIS — S81812A Laceration without foreign body, left lower leg, initial encounter: Secondary | ICD-10-CM | POA: Insufficient documentation

## 2019-12-26 DIAGNOSIS — H1132 Conjunctival hemorrhage, left eye: Secondary | ICD-10-CM | POA: Diagnosis not present

## 2019-12-26 DIAGNOSIS — J45909 Unspecified asthma, uncomplicated: Secondary | ICD-10-CM | POA: Diagnosis not present

## 2019-12-26 DIAGNOSIS — S0083XA Contusion of other part of head, initial encounter: Secondary | ICD-10-CM | POA: Insufficient documentation

## 2019-12-26 DIAGNOSIS — F17210 Nicotine dependence, cigarettes, uncomplicated: Secondary | ICD-10-CM | POA: Insufficient documentation

## 2019-12-26 DIAGNOSIS — R519 Headache, unspecified: Secondary | ICD-10-CM | POA: Diagnosis present
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
## 2019-12-26 NOTE — Discharge Instructions (Signed)
Please monitor your cut on your leg.  Please put antibiotic ointment on it 2-3 times a day and keep it clean and covered.  If it becomes significantly more red, swollen, you have any drainage from the wound or other concerns please seek additional medical care and evaluation.  Please take Ibuprofen (Advil, motrin) and Tylenol (acetaminophen) to relieve your pain.  You may take up to 600 MG (3 pills) of normal strength ibuprofen every 8 hours as needed.  In between doses of ibuprofen you make take tylenol, up to 1,000 mg (two extra strength pills).  Do not take more than 3,000 mg tylenol in a 24 hour period.  Please check all medication labels as many medications such as pain and cold medications may contain tylenol.  Do not drink alcohol while taking these medications.  Do not take other NSAID'S while taking ibuprofen (such as aleve or naproxen).  Please take ibuprofen with food to decrease stomach upset.
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
## 2019-12-26 NOTE — ED Notes (Signed)
Registration notified that pt would like to be made a confidential patient

## 2020-10-08 ENCOUNTER — Emergency Department (HOSPITAL_BASED_OUTPATIENT_CLINIC_OR_DEPARTMENT_OTHER)
Admission: EM | Admit: 2020-10-08 | Discharge: 2020-10-08 | Disposition: A | Discharge status: Home / Self Care | Payer: Self-pay | Attending: Emergency Medicine | Admitting: Emergency Medicine

## 2020-10-08 ENCOUNTER — Emergency Department (HOSPITAL_BASED_OUTPATIENT_CLINIC_OR_DEPARTMENT_OTHER): Payer: Self-pay

## 2020-10-08 ENCOUNTER — Encounter (HOSPITAL_BASED_OUTPATIENT_CLINIC_OR_DEPARTMENT_OTHER): Payer: Self-pay | Admitting: Emergency Medicine

## 2020-10-08 ENCOUNTER — Other Ambulatory Visit: Payer: Self-pay

## 2020-10-08 ENCOUNTER — Telehealth: Payer: Self-pay

## 2020-10-08 DIAGNOSIS — R109 Unspecified abdominal pain: Secondary | ICD-10-CM | POA: Insufficient documentation

## 2020-10-08 DIAGNOSIS — J45909 Unspecified asthma, uncomplicated: Secondary | ICD-10-CM | POA: Insufficient documentation

## 2020-10-08 DIAGNOSIS — F17200 Nicotine dependence, unspecified, uncomplicated: Secondary | ICD-10-CM | POA: Insufficient documentation

## 2020-10-08 LAB — COMPREHENSIVE METABOLIC PANEL
ALT: 20 U/L (ref 0–44)
AST: 21 U/L (ref 15–41)
Albumin: 4.3 g/dL (ref 3.5–5.0)
Alkaline Phosphatase: 111 U/L (ref 38–126)
Anion gap: 10 (ref 5–15)
BUN: 12 mg/dL (ref 6–20)
CO2: 25 mmol/L (ref 22–32)
Calcium: 9.2 mg/dL (ref 8.9–10.3)
Chloride: 104 mmol/L (ref 98–111)
Creatinine, Ser: 0.64 mg/dL (ref 0.44–1.00)
GFR, Estimated: >60 mL/min (ref >60)
Glucose, Bld: 104 mg/dL — ABNORMAL HIGH (ref 70–99)
Potassium: 3.3 mmol/L — ABNORMAL LOW (ref 3.5–5.1)
Sodium: 139 mmol/L (ref 135–145)
Total Bilirubin: 0.3 mg/dL (ref 0.3–1.2)
Total Protein: 7.1 g/dL (ref 6.5–8.1)

## 2020-10-08 LAB — CBC WITH DIFFERENTIAL/PLATELET
Abs Immature Granulocytes: 0.01 10*3/uL (ref 0.00–0.07)
Basophils Absolute: 0 10*3/uL (ref 0.0–0.1)
Basophils Relative: 0 %
Eosinophils Absolute: 0.2 10*3/uL (ref 0.0–0.5)
Eosinophils Relative: 2 %
HCT: 45.6 % (ref 36.0–46.0)
Hemoglobin: 14.9 g/dL (ref 12.0–15.0)
Immature Granulocytes: 0 %
Lymphocytes Relative: 26 %
Lymphs Abs: 2.3 10*3/uL (ref 0.7–4.0)
MCH: 29.3 pg (ref 26.0–34.0)
MCHC: 32.7 g/dL (ref 30.0–36.0)
MCV: 89.8 fL (ref 80.0–100.0)
Monocytes Absolute: 0.4 10*3/uL (ref 0.1–1.0)
Monocytes Relative: 5 %
Neutro Abs: 5.9 10*3/uL (ref 1.7–7.7)
Neutrophils Relative %: 67 %
Platelets: 323 10*3/uL (ref 150–400)
RBC: 5.08 MIL/uL (ref 3.87–5.11)
RDW: 12.4 % (ref 11.5–15.5)
WBC: 8.8 10*3/uL (ref 4.0–10.5)
nRBC: 0 % (ref 0.0–0.2)

## 2020-10-08 LAB — LIPASE, BLOOD: Lipase: 25 U/L (ref 11–51)

## 2020-10-08 MED ORDER — NAPROXEN 500 MG PO TABS: 500.0000 mg | ORAL_TABLET | Freq: Two times a day (BID) | ORAL | 0 refills | Status: AC

## 2020-10-08 MED ORDER — CYCLOBENZAPRINE HCL 10 MG PO TABS: 10.0000 mg | ORAL_TABLET | Freq: Every day | ORAL | 0 refills | Status: AC

## 2020-10-08 MED ORDER — IOHEXOL 300 MG/ML  SOLN
100.0000 mL | Freq: Once | INTRAMUSCULAR | Status: AC | PRN
  Administered 2020-10-08: 100 mL via INTRAVENOUS

## 2020-10-08 NOTE — ED Provider Notes (Addendum)
[facility] EMERGENCY DEPARTMENT Provider Note   CSN: [id number] Arrival date & time: 10/08/20  0831     History Chief Complaint  Patient presents with   Flank Pain    Marissa Murphy is a 45 y.o. female.  The history is provided by the patient.  Flank Pain This is a new problem. Episode onset: 2 months. The problem occurs daily. The problem has been gradually worsening. Associated symptoms include abdominal pain. Associated symptoms comments: No n/v/d.  No weight loss.  No SOB, cough or pleuritic pain.  Daily tobacco use but no heavy alcohol use.  No known injury.  She has an active job but no lifting.. Exacerbated by: worst at night when lying down.  milder during the day. The symptoms are relieved by NSAIDs. Treatments tried: goody's powder makes it better for awhile but then it comes back. The treatment provided no relief.      Past Medical History:  Diagnosis Date   Asthma    Thyroid disease     There are no problems to display for this patient.   History reviewed. No pertinent surgical history.   OB History   No obstetric history on file.     History reviewed. No pertinent family history.  Social History   Tobacco Use   Smoking status: Every Day    Pack years: 0.00   Smokeless tobacco: Never  Substance Use Topics   Alcohol use: Yes   Drug use: Not Currently    Types: Marijuana    Home Medications Prior to Admission medications   Medication Sig Start Date End Date Taking? Authorizing Provider  albuterol (PROVENTIL HFA;VENTOLIN HFA) 108 (90 BASE) MCG/ACT inhaler Inhale 2 puffs into the lungs every 6 (six) hours as needed for wheezing.    [provider]  omeprazole (PRILOSEC) 20 MG capsule Take 1 capsule (20 mg total) by mouth daily. 11/29/19   Layson Bertsch, Alphonzo Lemmings, MD  POT BICARB-POT CHLORIDE,25MEQ, 25 MEQ TBEF Take 2 tablets (50 mEq total) by mouth daily. 11/29/19   Gwyneth Sprout, MD    Allergies    Grapefruit bioflavonoid  complex and Shellfish allergy  Review of Systems   Review of Systems  Gastrointestinal:  Positive for abdominal pain.  Genitourinary:  Negative for difficulty urinating, dysuria, flank pain and vaginal bleeding.       Last menses was 1 year ago  All other systems reviewed and are negative.  Physical Exam Updated Vital Signs BP (!) 167/87 (BP Location: Right Arm)   Pulse 73   Temp 97.9 F (36.6 C) (Oral)   Resp 16   Ht 4\' 11"  (1.499 m)   Wt 55.8 kg   SpO2 100%   BMI 24.84 kg/m   Physical Exam Vitals and nursing note reviewed.  Constitutional:      General: She is not in acute distress.    Appearance: Normal appearance. She is well-developed and normal weight.  HENT:     Head: Normocephalic and atraumatic.  Eyes:     Pupils: Pupils are equal, round, and reactive to light.  Cardiovascular:     Rate and Rhythm: Normal rate and regular rhythm.     Heart sounds: Normal heart sounds. No murmur heard.   No friction rub.  Pulmonary:     Effort: Pulmonary effort is normal.     Breath sounds: Normal breath sounds. No wheezing or rales.  Abdominal:     General: Bowel sounds are normal. There is no distension.  Palpations: Abdomen is soft.     Tenderness: There is no abdominal tenderness. There is left CVA tenderness. There is no guarding or rebound.  Musculoskeletal:        General: No tenderness. Normal range of motion.     Cervical back: Normal range of motion and neck supple.     Right lower leg: No edema.     Comments: No edema  Skin:    General: Skin is warm and dry.     Findings: No rash.  Neurological:     Mental Status: She is alert and oriented to person, place, and time. Mental status is at baseline.     Cranial Nerves: No cranial nerve deficit.  Psychiatric:        Mood and Affect: Mood normal.        Behavior: Behavior normal.    ED Results / Procedures / Treatments   Labs (all labs ordered are listed, but only abnormal results are displayed) Labs  Reviewed  COMPREHENSIVE METABOLIC PANEL - Abnormal; Notable for the following components:      Result Value   Potassium 3.3 (*)    Glucose, Bld 104 (*)    All other components within normal limits  CBC WITH DIFFERENTIAL/PLATELET  LIPASE, BLOOD    EKG None  Radiology No results found.  Procedures Procedures   Medications Ordered in ED Medications - No data to display  ED Course  I have reviewed the triage vital signs and the nursing notes.  Pertinent labs & imaging results that were available during my care of the patient were reviewed by me and considered in my medical decision making (see chart for details).    MDM Rules/Calculators/A&P                          Patient is a 45 year old female with no known medical problems presenting today with complaints of left abdomen pain.  It is been progressive over the last 2 months.  Does not seem to be affected by any type of movement but is worse when she lays down at night.  She is having no other associated symptoms including weight loss, nausea vomiting diarrhea, urinary symptoms.  Patient's last menses was 1 year ago and has completely stopped.  She believes she is going through menopause as before her menses stopped she was having irregular menses.  She has no lower abdominal pain but does have some mild left upper quadrant pain.  She has no flank pain or radicular type symptoms.  She denies any respiratory symptoms.  Could be musculoskeletal in nature however will rule out any type of masses as the cause of her pain.  No lower abdominal symptoms concerning for ovarian pathology urinary pathology.  No symptoms concerning for appendicitis or diverticulitis. Patient until recently has not had insurance and had decreased access to medical care and has not had regular evaluation.  10:53 AM Labs are within normal limits.  CT is negative for acute findings.  No sx to suggest cholecystitis.  Suspect musculoskeletal issues.  Will treat with  NSAIDs and muscle relaxer.  Patient has applied but still does not have medical insurance.  We will send a consult to [facility] for PCP help.  MDM   Amount and/or Complexity of Data Reviewed Clinical lab tests: ordered and reviewed Tests in the radiology section of CPT: ordered and reviewed Independent visualization of images, tracings, or specimens: yes    Final Clinical Impression(s) /  ED Diagnoses Final diagnoses:  Left sided abdominal pain    Rx / DC Orders ED Discharge Orders          Ordered    cyclobenzaprine (FLEXERIL) 10 MG tablet  Daily at bedtime        10/08/20 1055    naproxen (NAPROSYN) 500 MG tablet  2 times daily        10/08/20 1055             Gwyneth Sprout, MD 10/08/20 1056    Gwyneth Sprout, MD 10/20/20 [phone]

## 2020-10-08 NOTE — Telephone Encounter (Signed)
Sent patient information on PCP Primary care at [facility], as [facility] is not accepting new patients at this time. They are referring to [facility] for new patients.

## 2020-10-08 NOTE — ED Triage Notes (Signed)
Pt arrives pov with c/o chronic left flank pain for "several months", denies dysuria, denies injury. Pt reports pain worsens at night

## 2020-10-08 NOTE — ED Notes (Signed)
Pt aware urine specimen ordered. Pt reports inability to provide specimen at this time. Specimen collection device provided to patient. 

## 2020-10-08 NOTE — Discharge Instructions (Addendum)
All the blood work was normal today.  The CAT scan was normal without any signs of cancer or pancreatitis or other issues.  The pain you are experiencing is most likely something in the muscle.  In addition to the medications you were prescribed you can also try a heating pad or muscle rub which may be helpful.  Avoid any heavy lifting or excessive twisting.

## 2020-10-08 NOTE — ED Notes (Signed)
Patient transported to CT 

## 2020-10-08 NOTE — ED Notes (Signed)
ED Provider at bedside. 
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
## 2022-05-03 IMAGING — CT CT ABD-PELV W/ CM
2 of 5 series · 17 of 46 positions shown, 19 images · IV contrast (Omnipaque)
Comparison: None.

CLINICAL DATA: Acute generalized abdominal pain.

EXAM:
CT ABDOMEN AND PELVIS WITH CONTRAST
TECHNIQUE: Multidetector CT imaging of the abdomen and pelvis was performed
using the standard protocol following bolus administration of
intravenous contrast.
CONTRAST:  100mL OMNIPAQUE IOHEXOL 300 MG/ML  SOLN

[Series 2: axial st · axial · 0.77mm/px · z∈[+772,+1127]mm · 14 of 81 slices shown, 16 images]
[im 5/81  soft-tissue]
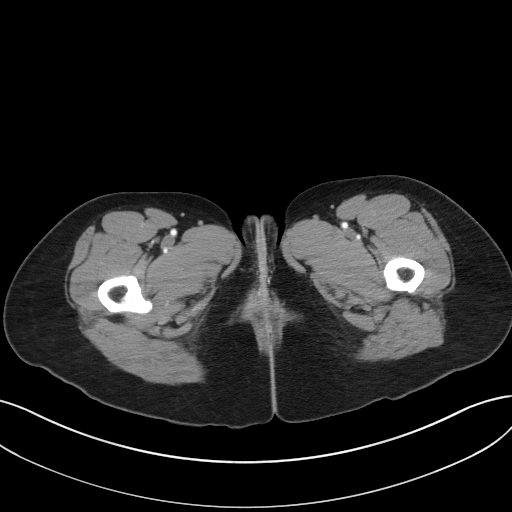
[im 5/81  bone]
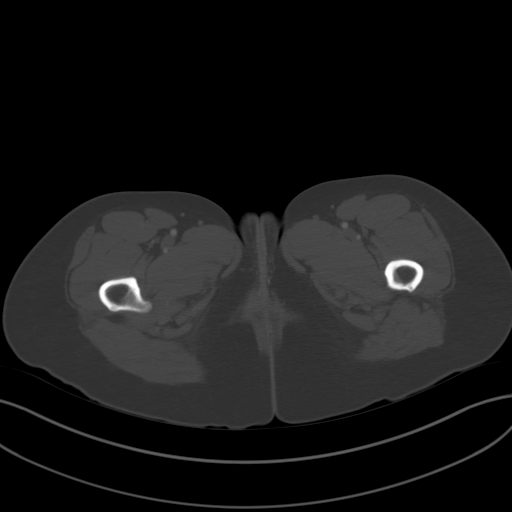
[im 9/81  soft-tissue]
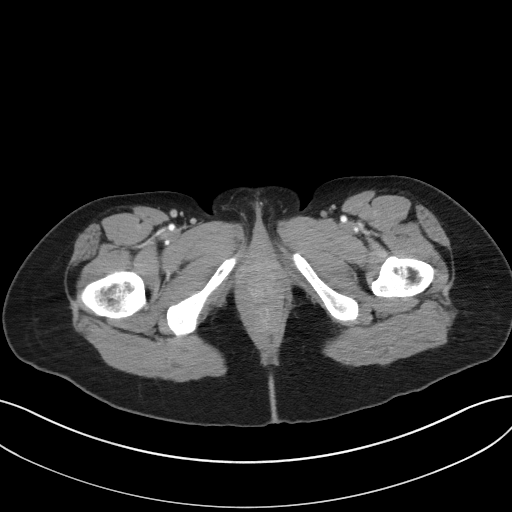
[im 17/81  soft-tissue]
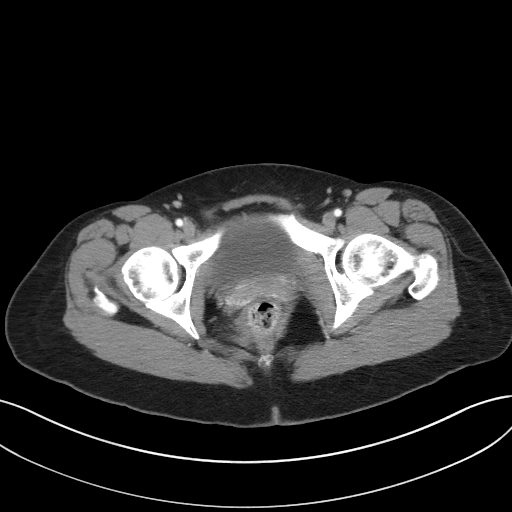
[im 22/81  soft-tissue]
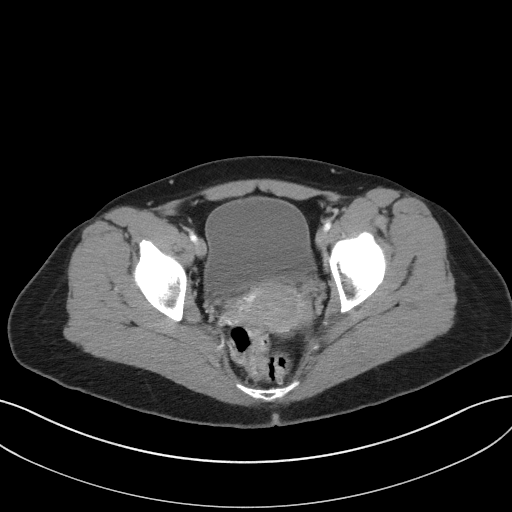
[im 26/81  soft-tissue]
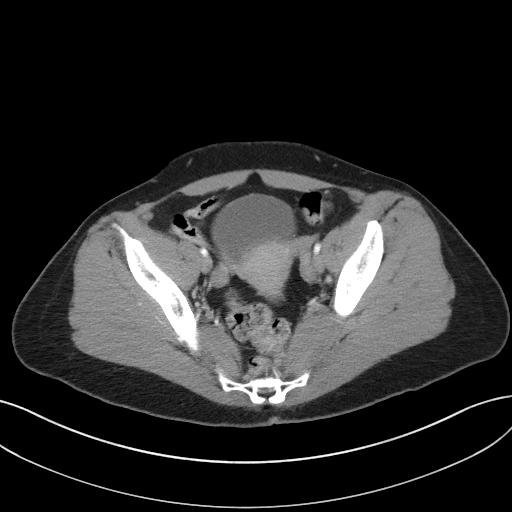
[im 34/81  soft-tissue]
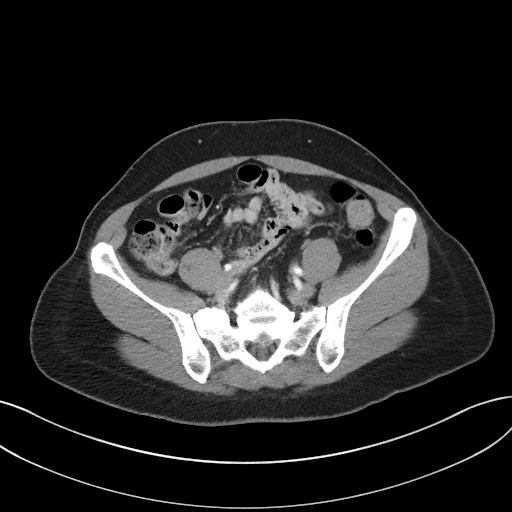
[im 38/81  soft-tissue]
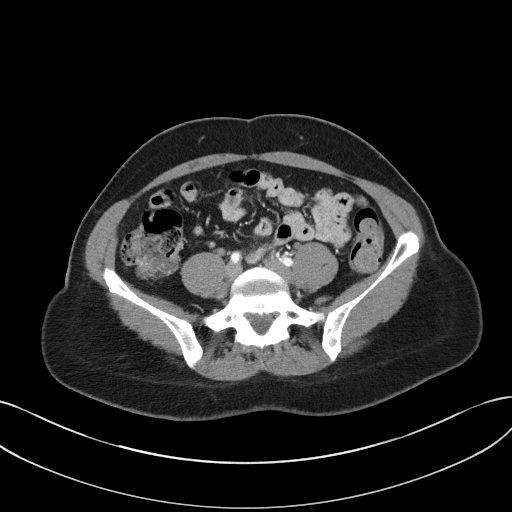
[im 43/81  soft-tissue]
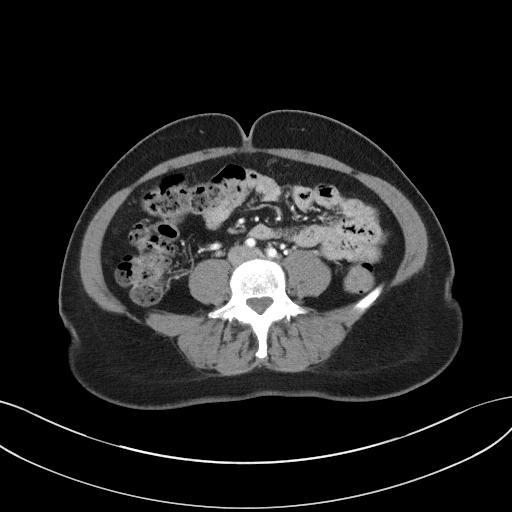
[im 47/81  soft-tissue]
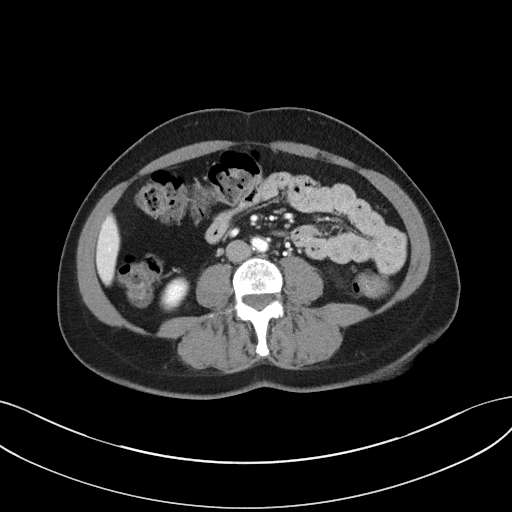
[im 47/81  bone]
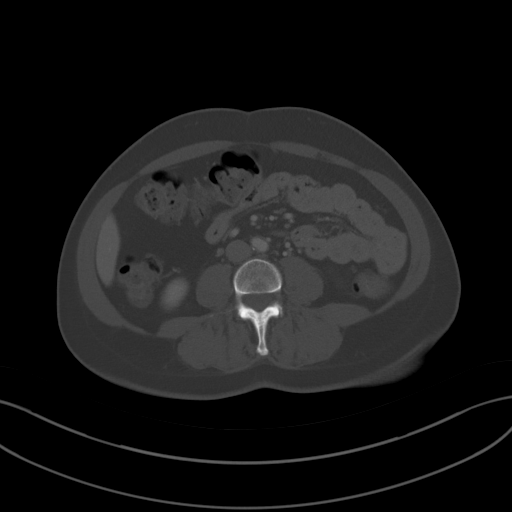
[im 55/81  soft-tissue]
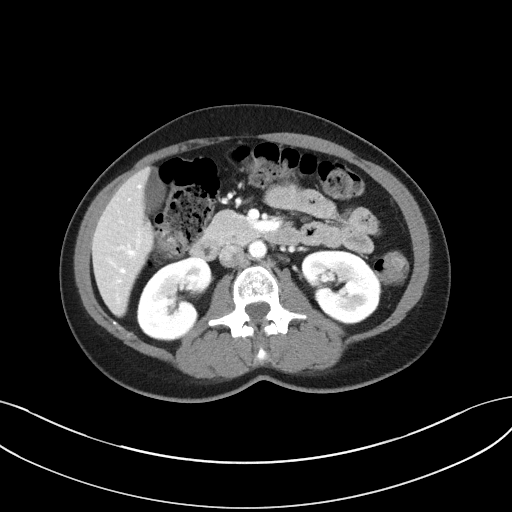
[im 59/81  soft-tissue]
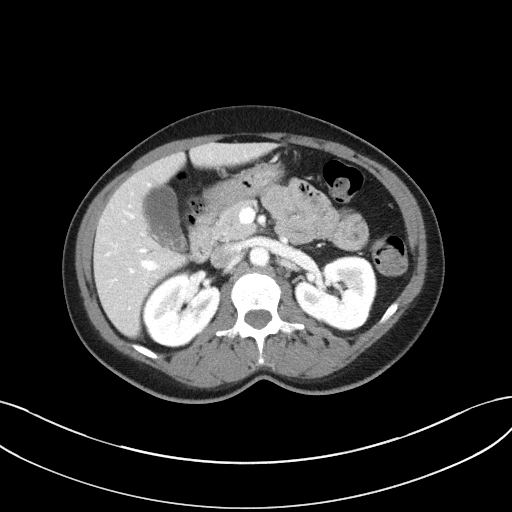
[im 64/81  soft-tissue]
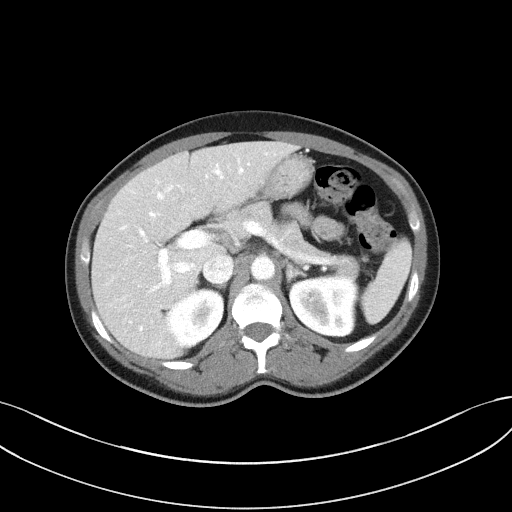
[im 72/81  soft-tissue]
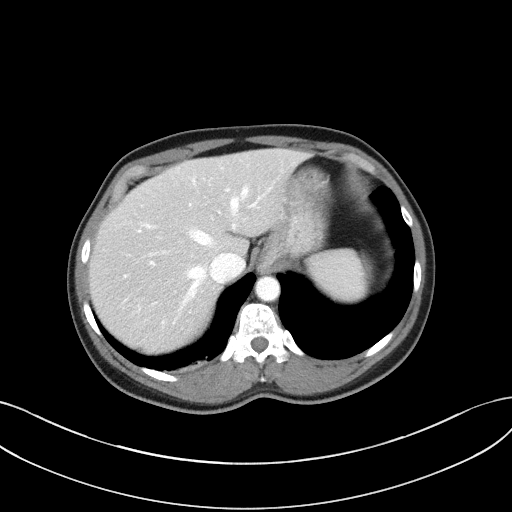
[im 76/81  soft-tissue]
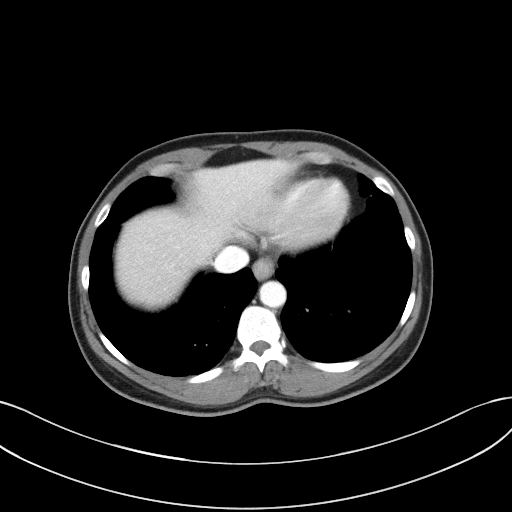

[Series 5: coronal st · coronal · 0.75mm/px · 3 of 80 slices shown]
[im 27/80  soft-tissue]
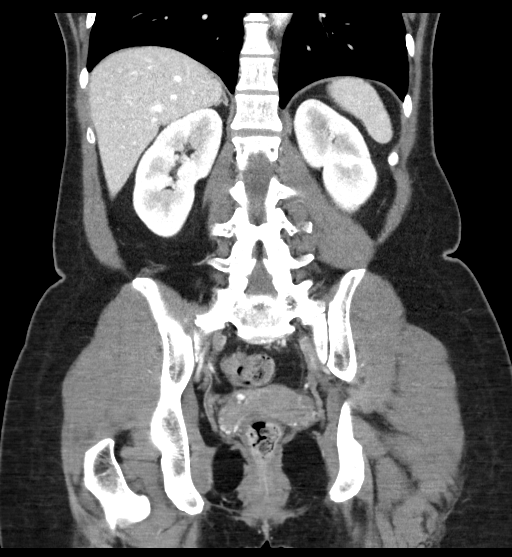
[im 36/80  soft-tissue]
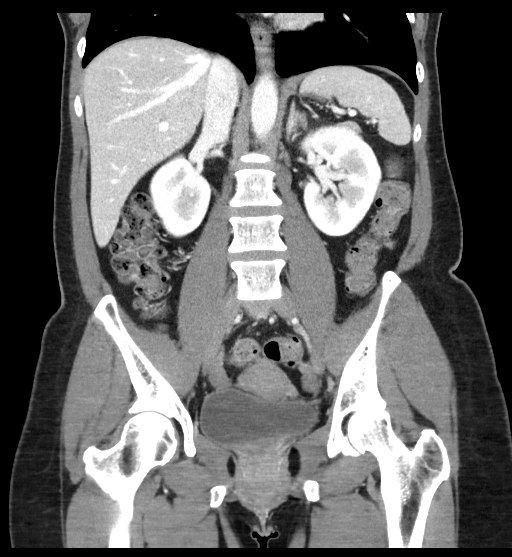
[im 44/80  soft-tissue]
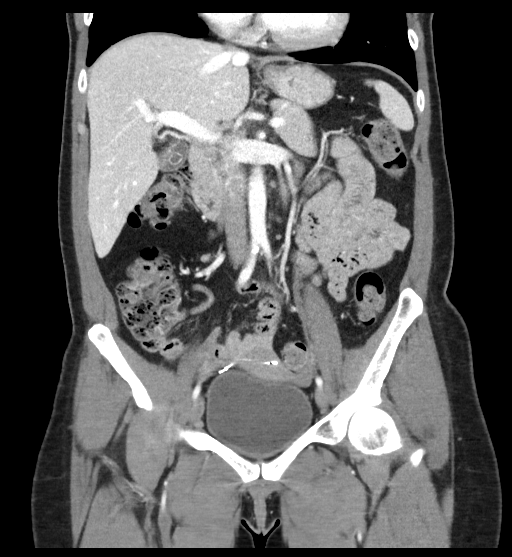

[17 of 46 positions shown; findings below may reference images not displayed]

FINDINGS: Lower chest: No acute abnormality.

Hepatobiliary: Cholelithiasis is noted. No biliary dilatation is
noted. Liver is unremarkable.

Pancreas: Unremarkable. No pancreatic ductal dilatation or
surrounding inflammatory changes.

Spleen: Normal in size without focal abnormality.

Adrenals/Urinary Tract: Adrenal glands are unremarkable. Kidneys are
normal, without renal calculi, focal lesion, or hydronephrosis.
Bladder is unremarkable.

Stomach/Bowel: Stomach is within normal limits. Appendix appears
normal. No evidence of bowel wall thickening, distention, or
inflammatory changes.

Vascular/Lymphatic: No significant vascular findings are present. No
enlarged abdominal or pelvic lymph nodes.

Reproductive: Uterus and bilateral adnexa are unremarkable.

Other: No abdominal wall hernia or abnormality. No abdominopelvic
ascites.

Musculoskeletal: No acute or significant osseous findings.
IMPRESSION: Cholelithiasis.

No other abnormality seen in the abdomen or pelvis.

## 2022-06-05 ENCOUNTER — Encounter (HOSPITAL_BASED_OUTPATIENT_CLINIC_OR_DEPARTMENT_OTHER): Payer: Self-pay

## 2022-06-05 ENCOUNTER — Emergency Department (HOSPITAL_BASED_OUTPATIENT_CLINIC_OR_DEPARTMENT_OTHER)
Admission: EM | Admit: 2022-06-05 | Discharge: 2022-06-05 | Disposition: A | Discharge status: Home / Self Care | Payer: BC Managed Care – PPO | Attending: Emergency Medicine | Admitting: Emergency Medicine

## 2022-06-05 DIAGNOSIS — F17200 Nicotine dependence, unspecified, uncomplicated: Secondary | ICD-10-CM | POA: Insufficient documentation

## 2022-06-05 DIAGNOSIS — Z1152 Encounter for screening for COVID-19: Secondary | ICD-10-CM | POA: Insufficient documentation

## 2022-06-05 DIAGNOSIS — J45909 Unspecified asthma, uncomplicated: Secondary | ICD-10-CM | POA: Diagnosis not present

## 2022-06-05 DIAGNOSIS — J029 Acute pharyngitis, unspecified: Secondary | ICD-10-CM | POA: Diagnosis present

## 2022-06-05 LAB — RESP PANEL BY RT-PCR (RSV, FLU A&B, COVID)  RVPGX2
Influenza A by PCR: NEGATIVE
Influenza B by PCR: NEGATIVE
Resp Syncytial Virus by PCR: NEGATIVE
SARS Coronavirus 2 by RT PCR: NEGATIVE

## 2022-06-05 LAB — GROUP A STREP BY PCR: Group A Strep by PCR: NOT DETECTED

## 2022-06-05 MED ORDER — DEXAMETHASONE 4 MG PO TABS
10.0000 mg | ORAL_TABLET | Freq: Once | ORAL | Status: AC
  Administered 2022-06-05: 10 mg via ORAL
  Filled 2022-06-05: qty 3

## 2022-06-05 MED ORDER — IBUPROFEN 400 MG PO TABS
600.0000 mg | ORAL_TABLET | Freq: Once | ORAL | Status: AC
  Administered 2022-06-05: 600 mg via ORAL
  Filled 2022-06-05: qty 1

## 2022-06-05 NOTE — ED Triage Notes (Signed)
States woke up with a sore throat, states "feels like something is in it".

## 2022-06-05 NOTE — ED Provider Notes (Signed)
[facility] Provider Note  CSN: [id number] Arrival date & time: 06/05/22 0941  Chief Complaint(s) Sore Throat  HPI Marissa Murphy is a 47 y.o. female without significant past medical history presenting to the emergency department sore throat.  Reports sore throat began this morning.  She reports recent runny nose, congestion, coughing.  No fevers or chills.  No swollen lymph nodes that she has noticed.  Has not tried anything for symptoms at home.  No sick contacts.   Past Medical History Past Medical History:  Diagnosis Date   Asthma    Thyroid disease    There are no problems to display for this patient.  Home Medication(s) Prior to Admission medications   Medication Sig Start Date End Date Taking? Authorizing Provider  albuterol (PROVENTIL HFA;VENTOLIN HFA) 108 (90 BASE) MCG/ACT inhaler Inhale 2 puffs into the lungs every 6 (six) hours as needed for wheezing.    [provider]  cyclobenzaprine (FLEXERIL) 10 MG tablet Take 1 tablet (10 mg total) by mouth at bedtime. 10/08/20   Blanchie Dessert, MD  metoprolol succinate (TOPROL-XL) 50 MG 24 hr tablet Take 1 tablet by mouth daily. 03/31/20   [provider]  naproxen (NAPROSYN) 500 MG tablet Take 1 tablet (500 mg total) by mouth 2 (two) times daily. 10/08/20   Blanchie Dessert, MD  omeprazole (PRILOSEC) 20 MG capsule Take 1 capsule (20 mg total) by mouth daily. 11/29/19   Plunkett, Loree Fee, MD  POT BICARB-POT CHLORIDE,25MEQ, 25 MEQ TBEF Take 2 tablets (50 mEq total) by mouth daily. 11/29/19   Blanchie Dessert, MD                                                                                                                                    Past Surgical History History reviewed. No pertinent surgical history. Family History History reviewed. No pertinent family history.  Social History Social History   Tobacco Use   Smoking status: Every Day   Smokeless  tobacco: Never  Substance Use Topics   Alcohol use: Not Currently   Drug use: Yes    Types: Marijuana    Comment: occasional   Allergies Grapefruit bioflavonoid complex and Shellfish allergy  Review of Systems Review of Systems  All other systems reviewed and are negative.   Physical Exam Vital Signs  I have reviewed the triage vital signs BP (!) 169/80 (BP Location: Right Arm)   Pulse 78   Temp 98 F (36.7 C) (Oral)   Resp 18   Ht '4\' 11"'$  (1.499 m)   Wt 54.4 kg   SpO2 99%   BMI 24.24 kg/m  Physical Exam Vitals and nursing note reviewed.  Constitutional:      General: She is not in acute distress.    Appearance: She is well-developed.  HENT:     Head: Normocephalic and atraumatic.     Mouth/Throat:  Mouth: Mucous membranes are moist.     Pharynx: Uvula midline. Posterior oropharyngeal erythema present. No pharyngeal swelling, oropharyngeal exudate or uvula swelling.     Tonsils: No tonsillar exudate or tonsillar abscesses.  Eyes:     Pupils: Pupils are equal, round, and reactive to light.  Cardiovascular:     Rate and Rhythm: Normal rate and regular rhythm.     Heart sounds: No murmur heard. Pulmonary:     Effort: Pulmonary effort is normal. No respiratory distress.     Breath sounds: Normal breath sounds.  Abdominal:     General: Abdomen is flat.     Palpations: Abdomen is soft.     Tenderness: There is no abdominal tenderness.  Musculoskeletal:        General: No tenderness.     Right lower leg: No edema.     Left lower leg: No edema.  Skin:    General: Skin is warm and dry.  Neurological:     General: No focal deficit present.     Mental Status: She is alert. Mental status is at baseline.  Psychiatric:        Mood and Affect: Mood normal.        Behavior: Behavior normal.     ED Results and Treatments Labs (all labs ordered are listed, but only abnormal results are displayed) Labs Reviewed  GROUP A STREP BY PCR  RESP PANEL BY RT-PCR (RSV,  FLU A&B, COVID)  RVPGX2                                                                                                                          Radiology No results found.  Pertinent labs & imaging results that were available during my care of the patient were reviewed by me and considered in my medical decision making (see MDM for details).  Medications Ordered in ED Medications  ibuprofen (ADVIL) tablet 600 mg (600 mg Oral Given 06/05/22 1013)  dexamethasone (DECADRON) tablet 10 mg (10 mg Oral Given 06/05/22 1014)                                                                                                                                     Procedures Procedures  (including critical care time)  Medical Decision Making / ED Course   MDM:  47 year old female presenting to the emergency department sore throat.  Patient well-appearing,  physical exam reassuring, posterior pharynx erythematous without tonsillar enlargement or exudate.  Floor of mouth soft.  Suspect likely viral URI.  Doubt bacterial infection such as strep throat although strep test pending.  Doubt Ludwig's angina, retropharyngeal abscess, peritonsillar abscess given reassuring exam.  Will give Decadron, Motrin for symptoms in the emergency department.  Anticipate discharge following results of group A strep testing.  COVID/flu testing also sent.  Clinical Course as of 06/05/22 1038  Tue Jun 05, 2022  1037 GAS PCR negative. Will discharge patient to home. All questions answered. Patient comfortable with plan of discharge. Return precautions discussed with patient and specified on the after visit summary.  [WS]    Clinical Course User Index [WS] Cristie Hem, MD     Additional history obtained: -External records from outside source obtained and reviewed including: Chart review including previous notes, labs, imaging, consultation notes including ED visit 02/20/22   Lab Tests: -I ordered, reviewed, and  interpreted labs.   The pertinent results include:   Labs Reviewed  GROUP A STREP BY PCR  RESP PANEL BY RT-PCR (RSV, FLU A&B, COVID)  [id number]    Notable for normal strep PCR, negative covid/flu test    Medicines ordered and prescription drug management: Meds ordered this encounter  Medications   ibuprofen (ADVIL) tablet 600 mg   dexamethasone (DECADRON) tablet 10 mg    -I have reviewed the patients home medicines and have made adjustments as needed  Social Determinants of Health:  Diagnosis or treatment significantly limited by social determinants of health: current smoker   Reevaluation: After the interventions noted above, I reevaluated the patient and found that they have improved  Co morbidities that complicate the patient evaluation  Past Medical History:  Diagnosis Date   Asthma    Thyroid disease       Dispostion: Disposition decision including need for hospitalization was considered, and patient discharged from emergency department.    Final Clinical Impression(s) / ED Diagnoses Final diagnoses:  Sore throat     This chart was dictated using voice recognition software.  Despite best efforts to proofread,  errors can occur which can change the documentation meaning.    Cristie Hem, MD 06/05/22 1038

## 2022-06-05 NOTE — Discharge Instructions (Signed)
We evaluated you for your sore throat.  Your strep test was negative.  Please take Tylenol and Motrin for your symptoms at home.  You can take 650 mg of Tylenol every 6 hours and 600 mg of ibuprofen every 6 hours as needed for your symptoms.  You can take these medicines together as needed, either at the same time, or alternating every 3 hours.  Please return to the emergency department if your symptoms worsen, you develop difficulty breathing, difficulty swallowing, vomiting, severe pain, or any other concerning symptoms.

## 2023-10-02 ENCOUNTER — Emergency Department (HOSPITAL_BASED_OUTPATIENT_CLINIC_OR_DEPARTMENT_OTHER)

## 2023-10-02 ENCOUNTER — Other Ambulatory Visit: Payer: Self-pay

## 2023-10-02 ENCOUNTER — Encounter (HOSPITAL_BASED_OUTPATIENT_CLINIC_OR_DEPARTMENT_OTHER): Payer: Self-pay

## 2023-10-02 DIAGNOSIS — R519 Headache, unspecified: Secondary | ICD-10-CM | POA: Insufficient documentation

## 2023-10-02 DIAGNOSIS — Z5321 Procedure and treatment not carried out due to patient leaving prior to being seen by health care provider: Secondary | ICD-10-CM | POA: Insufficient documentation

## 2023-10-02 DIAGNOSIS — R079 Chest pain, unspecified: Secondary | ICD-10-CM | POA: Diagnosis present

## 2023-10-02 DIAGNOSIS — H538 Other visual disturbances: Secondary | ICD-10-CM | POA: Insufficient documentation

## 2023-10-02 LAB — CBC
HCT: 39.8 % (ref 36.0–46.0)
Hemoglobin: 13.3 g/dL (ref 12.0–15.0)
MCH: 29.2 pg (ref 26.0–34.0)
MCHC: 33.4 g/dL (ref 30.0–36.0)
MCV: 87.3 fL (ref 80.0–100.0)
Platelets: 339 10*3/uL (ref 150–400)
RBC: 4.56 MIL/uL (ref 3.87–5.11)
RDW: 12.5 % (ref 11.5–15.5)
WBC: 10.9 10*3/uL — ABNORMAL HIGH (ref 4.0–10.5)
nRBC: 0 % (ref 0.0–0.2)

## 2023-10-02 LAB — BASIC METABOLIC PANEL WITH GFR
Anion gap: 14 (ref 5–15)
BUN: 15 mg/dL (ref 6–20)
CO2: 23 mmol/L (ref 22–32)
Calcium: 9.3 mg/dL (ref 8.9–10.3)
Chloride: 103 mmol/L (ref 98–111)
Creatinine, Ser: 0.63 mg/dL (ref 0.44–1.00)
GFR, Estimated: >60 mL/min (ref >60)
Glucose, Bld: 112 mg/dL — ABNORMAL HIGH (ref 70–99)
Potassium: 3.3 mmol/L — ABNORMAL LOW (ref 3.5–5.1)
Sodium: 140 mmol/L (ref 135–145)

## 2023-10-02 LAB — TROPONIN T, HIGH SENSITIVITY: Troponin T High Sensitivity: <15 ng/L (ref <19)

## 2023-10-02 NOTE — ED Triage Notes (Signed)
 Pt arrives with c/o CP and headache. Per pt, her headache started 6 days ago and the CP started yesterday. Pt seen yesterday for headache. Pt reports intermittent blurred vision. Pt denies SOB.

## 2023-10-03 ENCOUNTER — Emergency Department (HOSPITAL_BASED_OUTPATIENT_CLINIC_OR_DEPARTMENT_OTHER)
Admission: EM | Admit: 2023-10-03 | Discharge: 2023-10-03 | Discharge status: Against Medical Advice | Attending: Emergency Medicine | Admitting: Emergency Medicine
